# Patient Record
Sex: Male | Born: 1990 | Race: White | Hispanic: No | Marital: Married | Smoking: Never smoker
Health system: Southern US, Community
[De-identification: ages and names within clinical notes are randomized; demographics above are authoritative.]

## PROBLEM LIST (undated history)

## (undated) DIAGNOSIS — R2689 Other abnormalities of gait and mobility: Secondary | ICD-10-CM

## (undated) DIAGNOSIS — D821 Di George's syndrome: Secondary | ICD-10-CM

## (undated) DIAGNOSIS — I519 Heart disease, unspecified: Secondary | ICD-10-CM

## (undated) HISTORY — PX: CARDIAC SURGERY: SHX584

## (undated) HISTORY — DX: Di George's syndrome: D82.1

## (undated) HISTORY — DX: Other abnormalities of gait and mobility: R26.89

## (undated) HISTORY — DX: Heart disease, unspecified: I51.9

---
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
## 2010-07-16 NOTE — Op Note (Signed)
. [facility]  Patient:    Bradley Cobb, Bradley Cobb                      MRN: [id number] Proc. Date: 09/08/99 Adm. Date:  [id number] Attending:  Maurilio Lovely                           Operative Report  PREOPERATIVE DIAGNOSES: 1. Multiple dental caries. 2. Phimosis. 3. 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 4. Status post repair of complex cardiac anomaly, bilateral undescended    testicles and a Nissen fundoplication with gastrostomy.  POSTOPERATIVE DIAGNOSES: 1. Multiple dental caries. 2. Phimosis. 3. 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 4. Status post repair of complex cardiac anomaly, bilateral undescended    testicles and a Nissen fundoplication with gastrostomy.  OPERATION PERFORMED:  Repair of multiple dental caries by Dr. Rick Duff and circumcision by Dr. Levie Heritage.  PROCEDURE: Patient under satisfactory general endotracheal anesthesia, while Dr. Rick Duff was performing her dental surgical procedure, genital area was thoroughly prepped and draped in the usual manner.  Circumferential incision was made over the distal aspect of the penis.  Skin was undermined distally. Bleeders clamped, cut and electrocoagulated.  Dorsal slit incision was made and during this time, dense adhesions between the prepuce and glans were dissected.  There was avulsion of the mucosa around the left lower aspect of the junction between the prepuce and the glans penis.  The remainder of the mucosa was incised about 3 mm from the coronal sulcus.  The redundant prepuce and mucosa were excised.  And now the approximation of the mucosa and the skin was done with running interlocking sutures over most of the circumference except in the left lower quadrant area where the mucosa was avulsed. At this area, interrupted sutures were placed carefully to bring in the remainder of the glans tissue to the skin.  Hemostasis was satisfactory and 0.25% with epinephrine was injected locally for postoperative  analgesia. 5-0 chromic interrupted, as well as, running interlocking sutures.  Neosporin dressing applied.  Throughout the procedure, patients vital signs remained stable, patient withstood the procedure well and was transferred to recovery room in satisfactory general condition. DD:  09/08/99 TD:  09/08/99 Job: [id number]

## 2010-07-16 NOTE — Op Note (Signed)
[name]. [facility]  Patient:    Bradley Cobb, Bradley Cobb Visit Number: [id number] MRN: [id number]          Service Type: DSU Location: [facility] 2899 28 Attending Physician:  Damita Dunnings Dictated by:   Cleotilde Neer. Jeanella Craze, D.D.S., MPH Proc. Date: 05/25/01 Admit Date:  05/25/2001 Discharge Date: 05/25/2001                             Operative Report  PREOPERATIVE DIAGNOSES: 1. Multiple carious teeth. 2. DiGeorge syndrome.  POSTOPERATIVE DIAGNOSES: 1. Multiple carious teeth. 2. DiGeorge syndrome.  PROCEDURE PERFORMED:  Full mouth dental rehabilitation.  SPECIMENS:  One tooth for count only.  DRAINS:  None.  CULTURES: None.  ESTIMATED BLOOD LOSS:  Less than 5 cc.  PROCEDURE:  The patient was brought from the holding area to OR #1 at [facility].  The patient was placed in the supine position on the operating table.  General anesthesia was induced by mask.  IV access was obtained and direct nasotracheal intubation was established.  Six intraoral radiographs were obtained and a throat pack was placed.  The dental treatment was as follows:  Tooth C and H received stainless steel crowns with a composite resin facing. Tooth H also received a pulpotomy.  Teeth 19 and 30 received sealants.  Tooth 3 received a composite resin.  Lidocaine 1.8 cc 2% with 1:100,000 epinephrine was administered.  Tooth #14 was elevated and extracted with forceps.  Gelfoam was placed in the sockets.  Two chromic gut sutures were placed.  Hemorrhage control was good.  The mouth was thoroughly cleansed and the throat pack was removed.  The patient was undraped and extubated in the operating room.  The patient tolerated the procedure well and was taken to the PACU in stable condition with IV in place. Dictated by:   Cleotilde Neer. Jeanella Craze, D.D.S., MPH Attending Physician:  Damita Dunnings DD:  05/25/01 TD:  05/26/01 Job: [phone]

## 2010-07-16 NOTE — Op Note (Signed)
[place]. [facility]  Patient:    Bradley Cobb, Bradley Cobb                      MRN: [id number] Proc. Date: 09/08/99 Adm. Date:  [id number] Disc. Date: [id number] Attending:  Maurilio Lovely                           Operative Report  PREOPERATIVE DIAGNOSES: 1. Multiple carious teeth. 2. DeGeorge syndrome.  POSTOPERATIVE DIAGNOSES: 1. Multiple carious teeth. 2. DeGeorge syndrome.  PROCEDURE:  Full-mouth dental rehabilitation.  SURGEON:  Cipriano Mile. Rick Duff, D.D.S., M.S.  ASSISTANT:  Abigail Butts.  SPECIMENS:  Two teeth for count only, given to mother.  DRAINS:  None.  CULTURES:  None.  ESTIMATED BLOOD LOSS:  Less than 5 cc.  DESCRIPTION OF PROCEDURE:  The patient was brought from the holding area to OR room #1 at [facility].  The patient was placed in the supine position on the operating table, and general anesthesia was induced by mask with sevoflurane, nitrous oxide, and oxygen.  IV access was obtained, and direct nasoendotracheal intubation was established.  Five intraoral radiographs were obtained, and a throat pack was placed at 9:20 a.m.  The dental treatment was as follows:  All teeth being treated were isolated with the rubber dam.  Teeth #14 and 30 received occlusal composites.  Teeth C, H, and M received facial composites.  These teeth were acid etched, single-bone adhesive, Flowit composite was placed followed by Z100 composite, and Delton opaque sealant.  Tooth #3 received a sealant.  This tooth was acid etched, single-bone adhesive, and Delton opaque sealant was placed.  Teeth #B, I, L, and S received stainless steel crowns, Ion #3, cemented with Fuji cement.  To obtain local anesthesia and hemorrhage control, 0.9 cc of 2% lidocaine with 1:100,000 epinephrine was used.  Teeth #N and Q were extracted for space and eruption of teeth #24 and 25.  The mouth was thoroughly cleansed, and the throat pack was removed.  The patient was  undraped and extubated in the operating room.  The patient tolerated the procedures well and was taken to the PACU in stable condition with IV in place.  Dr. Levie Heritage followed this procedure with circumcision, and his note should follow. DD:  09/10/99 TD:  09/10/99 Job: [id number]

## 2011-02-15 IMAGING — CR DG KNEE COMPLETE 4+V*L*
5 series · 5 of 5 positions shown · non-contrast
Comparison: None

CLINICAL DATA: Twisted left knee.

LEFT KNEE - COMPLETE 4+ VIEW

[t knee ap left]
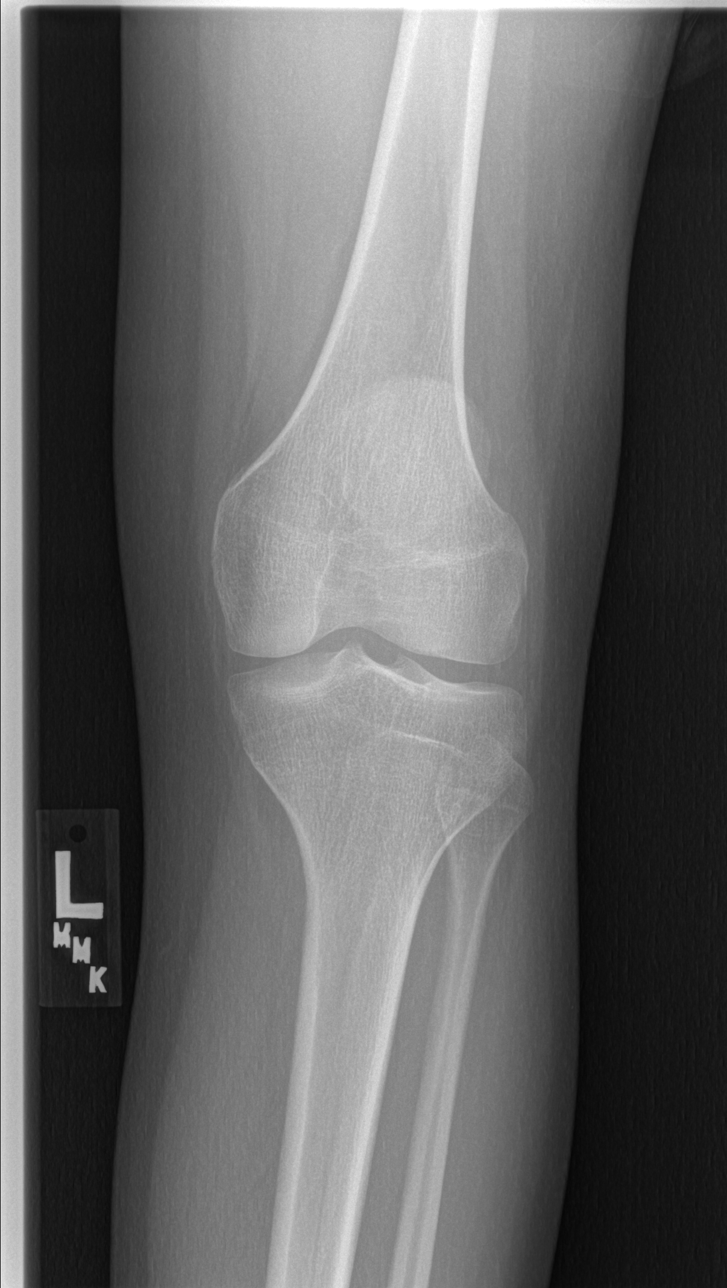

[t knee oblique left (1 of 2)]
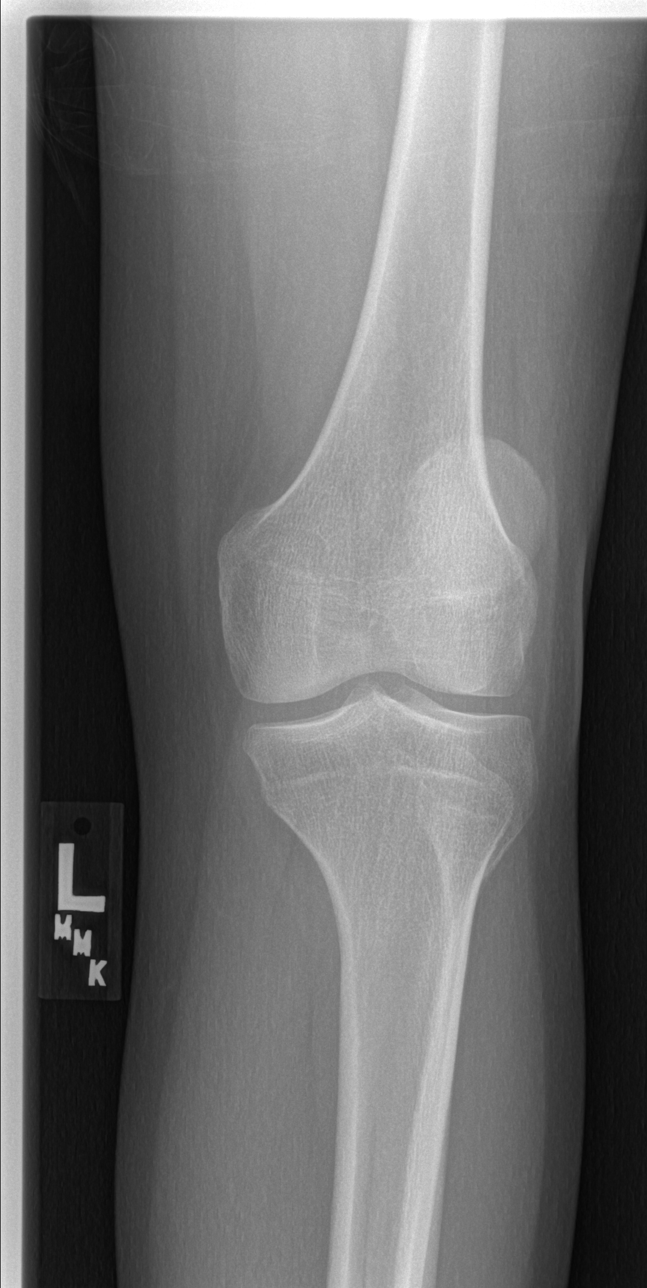

[t knee oblique left (2 of 2)]
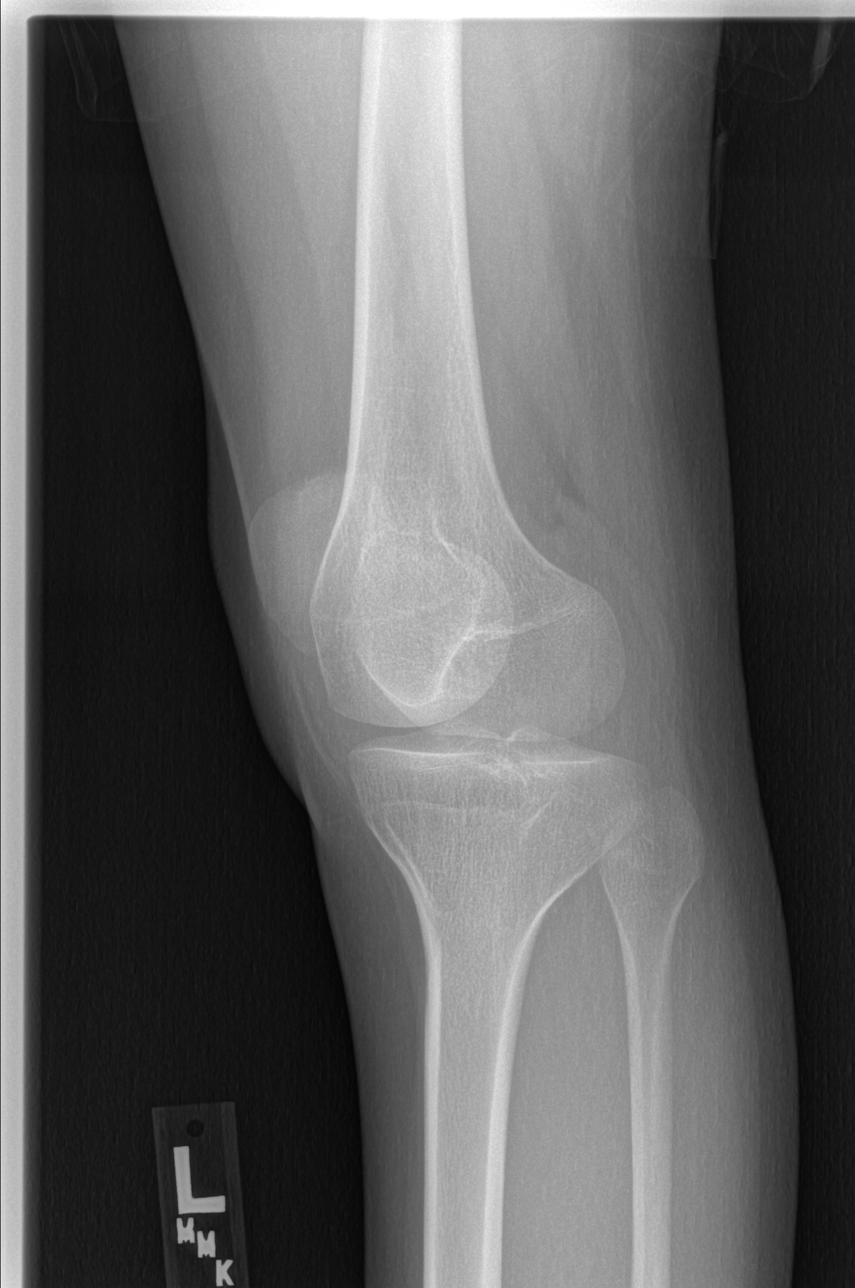

[t knee lat left (1 of 2)]
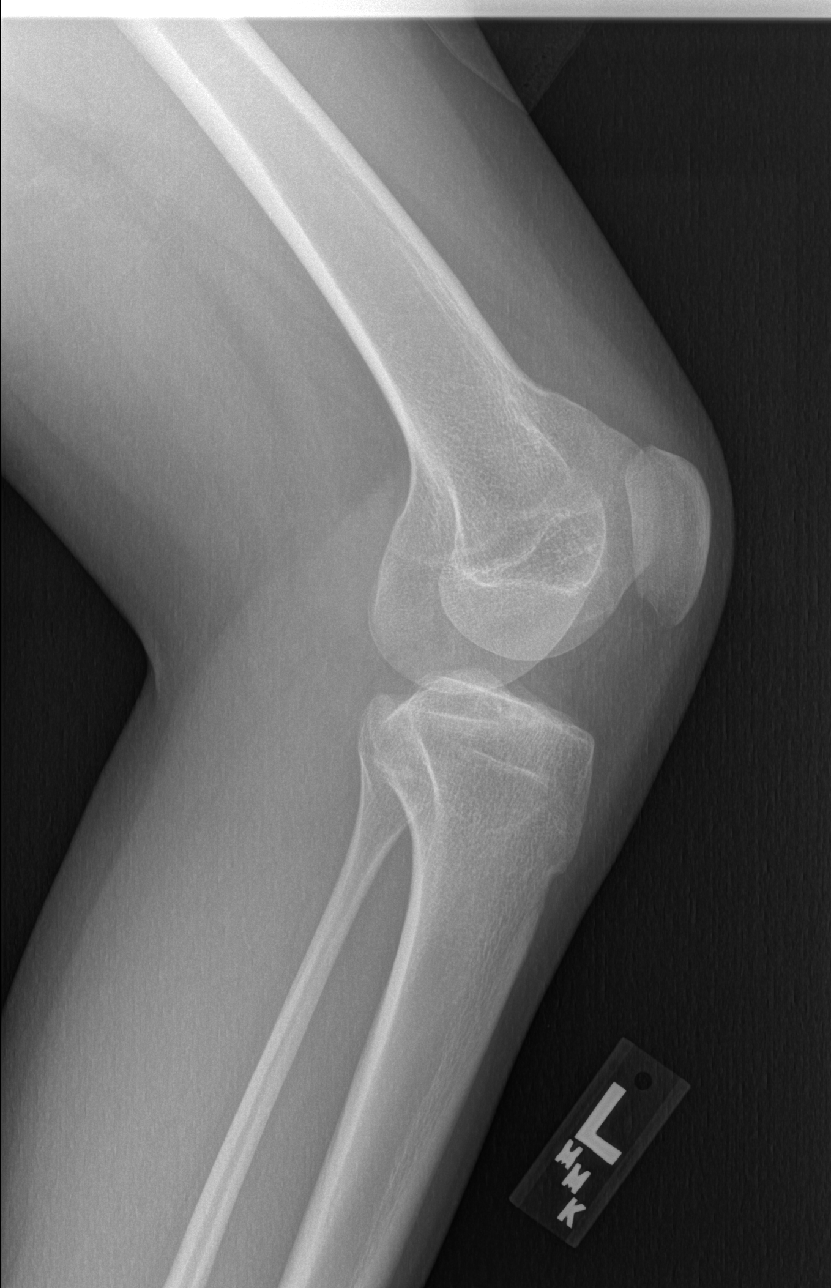

[t knee lat left (2 of 2)]
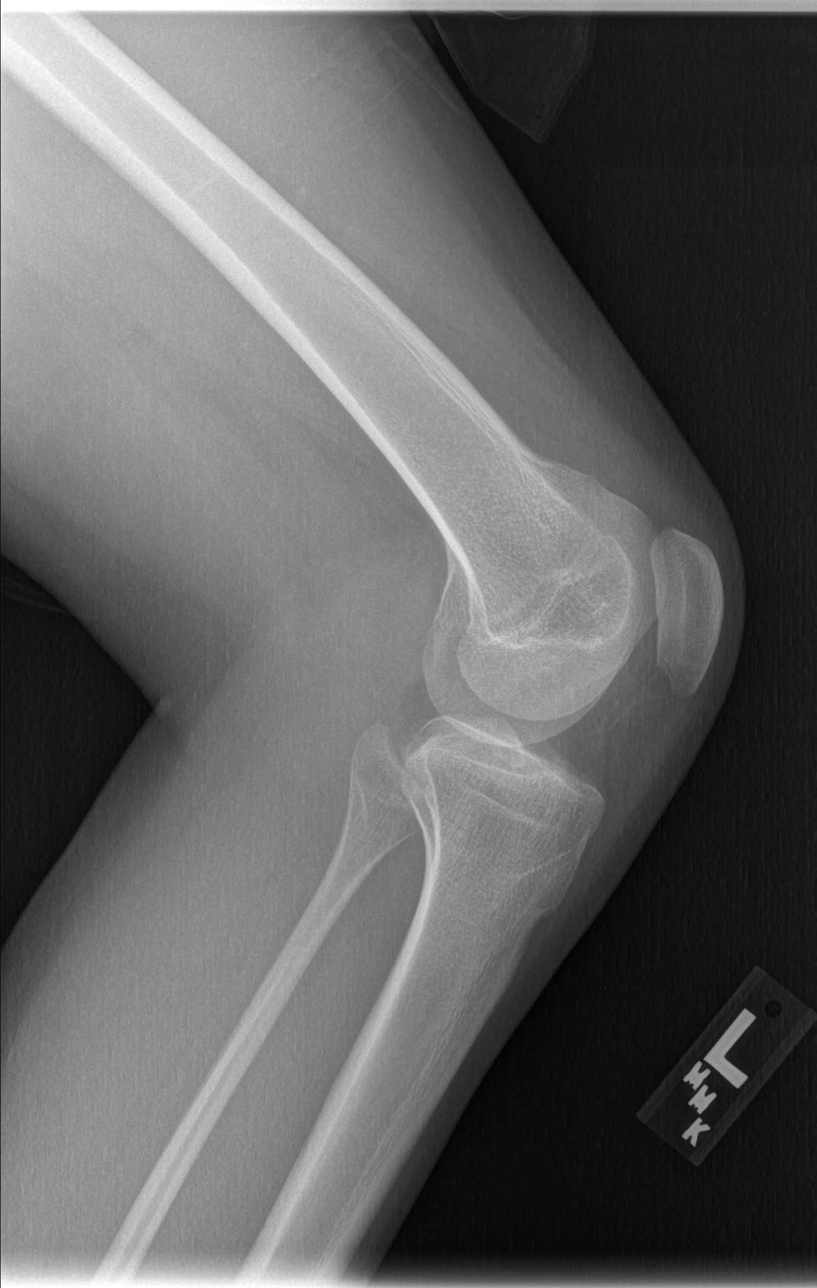

[5 of 5 positions shown; findings below may reference images not displayed]

FINDINGS: The joint spaces are maintained.  No fractures are seen.
IMPRESSION: No acute bony findings.

## 2011-10-18 DIAGNOSIS — D821 Di George's syndrome: Secondary | ICD-10-CM | POA: Insufficient documentation

## 2012-08-09 DIAGNOSIS — R6250 Unspecified lack of expected normal physiological development in childhood: Secondary | ICD-10-CM | POA: Insufficient documentation

## 2015-05-01 ENCOUNTER — Encounter: Payer: Self-pay | Admitting: *Deleted

## 2015-05-07 ENCOUNTER — Encounter: Payer: Self-pay | Admitting: Neurology

## 2015-05-07 ENCOUNTER — Ambulatory Visit (INDEPENDENT_AMBULATORY_CARE_PROVIDER_SITE_OTHER): Payer: Self-pay | Admitting: Neurology

## 2015-05-07 VITALS — BP 112/76 | Ht <= 58 in | Wt 115.5 lb

## 2015-05-07 DIAGNOSIS — R269 Unspecified abnormalities of gait and mobility: Secondary | ICD-10-CM

## 2015-05-07 NOTE — Progress Notes (Signed)
Patient: Bradley Cobb MRN: [id number] Sex: male DOB: [birth year]  Provider: Keturah ShaversNABIZADEH, Carrington Olazabal, MD [facility]  Note type: Nx Patient  Referral Source: Dr. Asencion Partridgeamille Andy History [facility] and [facility]ive mother Chief Complaint: DiGeorge syndrome, Gait instability, Balance problems  History of Present Illness: Bradley Cobb is a 25 y.o. male is here as a patient with new complaints. I discussed with mother that due to his age which is [age] at this time, it would be the best of his interest to see an adult neurology since he might need more diagnostic workup or treatment so it would be better to start this new complaint with adult neurology and continue follow-up with them. I did not perform any history or exam and there would be no charge for this visit.  Allergies  Allergen Reactions  . Sulfa Antibiotics Nausea And Vomiting  . Other     Seasonal Allergies    . Amoxicillin Nausea And Vomiting    Physical Exam BP 112/76 mmHg  Ht 4\' 10"  (1.473 m)  Wt 115 lb 8.3 oz (52.4 kg)  BMI 24.15 kg/m2 Exam was not performed.

## 2015-07-16 ENCOUNTER — Ambulatory Visit (INDEPENDENT_AMBULATORY_CARE_PROVIDER_SITE_OTHER): Payer: Medicaid Other | Admitting: Neurology

## 2015-07-16 ENCOUNTER — Encounter: Payer: Self-pay | Admitting: Neurology

## 2015-07-16 ENCOUNTER — Telehealth: Payer: Self-pay | Admitting: Neurology

## 2015-07-16 VITALS — BP 116/80 | HR 92 | Ht <= 58 in | Wt 117.0 lb

## 2015-07-16 DIAGNOSIS — R6250 Unspecified lack of expected normal physiological development in childhood: Secondary | ICD-10-CM | POA: Diagnosis not present

## 2015-07-16 DIAGNOSIS — D821 Di George's syndrome: Secondary | ICD-10-CM | POA: Diagnosis not present

## 2015-07-16 DIAGNOSIS — R269 Unspecified abnormalities of gait and mobility: Secondary | ICD-10-CM | POA: Diagnosis not present

## 2015-07-16 NOTE — Progress Notes (Signed)
PATIENT: Bradley Cobb DOB: 1990-09-07  Chief Complaint  Patient presents with  . Gait Instability    "Bradley Cobb" is here with his adoptive mother, Vesta Mixer.  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  His mother reports his perception of instability has worsened and it has caused him to have an extreme fear of falling.       HISTORICAL  Bradley Cobb is a 25 years old male, accompanied by his mother, seen in refer by his primary care physician Dr. Willow Ora for evaluation of worsening anxiety, unsteady gait  Patient carry a diagnosis of DiGeorge disease, he was adopted by his mother Vesta Mixer at age 66, he was developmentally delayed, return to walk talk at age 12, went to special education, at baseline, he is hypersensitive to any sensory stimulation, need help in dressing and self cleaning  Ever since childhood, he tends to have fear anxiety when noticed uneven terrain, or any foreign subject on the floor, getting worse since 2016, he will not go to his bedroom, has to hold on somebody's hand, after a floor runner was put in, he did much better, he tends to touch subject while walking at his home,  There was otherwise no significant regression on the other part of his daily function  REVIEW OF SYSTEMS: Full 14 system review of systems performed and notable only for gait abnormality, snoring  ALLERGIES: Allergies  Allergen Reactions  . Sulfa Antibiotics Nausea And Vomiting  . Other     Seasonal Allergies    . Amoxicillin Nausea And Vomiting    HOME MEDICATIONS: Current Outpatient Prescriptions  Medication Sig Dispense Refill  . budesonide (RHINOCORT AQUA) 32 MCG/ACT nasal spray Place into the nose.    . loratadine (CLARITIN) 10 MG tablet Take 10 mg by mouth.    . sodium fluoride (SF 5000 PLUS) 1.1 % CREA dental cream Place 1 application onto teeth at bedtime.      No current facility-administered medications for this visit.    PAST MEDICAL HISTORY: Past  Medical History  Diagnosis Date  . 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 Heart disease   . Imbalance     PAST SURGICAL HISTORY: Past Surgical History  Procedure Laterality Date  . Cardiac surgery  1992  . Hernia repair  2001    FAMILY HISTORY: Family History  Problem Relation Age of Onset  . Adopted: Yes  . Mental illness Mother     SOCIAL HISTORY:  Social History   Social History  . Marital Status: Married    Spouse Name: N/A  . Number of Children: 0  . Years of Education: Certificat   Occupational History  . Disabled    Social History Main Topics  . Smoking status: Never Smoker   . Smokeless tobacco: Never Used  . Alcohol Use: No  . Drug Use: No  . Sexual Activity: No   Other Topics Concern  . Not on file   Social History Narrative   Bradley Cobb attends [employer] three days a week.   He lives with his adoptive parents. He has an older adoptive sister.   Right-handed.   Drinks three soft drinks weekly.     PHYSICAL EXAM   Filed Vitals:   07/16/15 1010  BP: 116/80  Pulse: 92  Height:  (1.473 m)  Weight: 117 lb (53.071 kg)    Not recorded      Body mass index is 24.46 kg/(m^2).  PHYSICAL EXAMNIATION:  Gen: NAD, conversant, well nourised, obese, well groomed                     Cardiovascular: Regular rate rhythm, no peripheral edema, warm, nontender. Eyes: Conjunctivae clear without exudates or hemorrhage Neck: Supple, no carotid bruise. Pulmonary: Clear to auscultation bilaterally   NEUROLOGICAL EXAM:  MENTAL STATUS: Cooperative on examination, follow commands   CRANIAL NERVES: CN II: Visual fields are full to confrontation.  Pupils are round equal and briskly reactive to light. CN III, IV, VI: extraocular movement are normal. No ptosis. CN V: Facial sensation is intact to pinprick in all 3 divisions bilaterally. Corneal responses are intact.  CN VII: Face is symmetric with normal eye closure and smile. CN VIII: Hearing is  normal to rubbing fingers CN IX, X: Palate elevates symmetrically. Phonation is normal. CN XI: Head turning and shoulder shrug are intact CN XII: Tongue is midline with normal movements and no atrophy.  MOTOR: There is no pronator drift of out-stretched arms. Muscle bulk and tone are normal. Muscle strength is normal.  REFLEXES: Present and symmetric. Plantar responses are flexor.  SENSORY: Withdraw to pain  COORDINATION: Rapid alternating movements and fine finger movements are intact. There is no dysmetria on finger-to-nose and heel-knee-shin.    GAIT/STANCE: mildly unsteady, but his gait would much improved if he hold on  DIAGNOSTIC DATA (LABS, IMAGING, TESTING) - I reviewed patient records, labs, notes, testing and imaging myself where available.   ASSESSMENT AND PLAN  Bradley Cobb is a 25 y.o. male   DiGeorge Syndrome Mental retardation Worsening gait difficulty  Need to rule out central nervous system etiology  MRI of the brain without contrast, [facility], he will have dental cleaning in August under general anesthesia  Levert FeinsteinYijun Jamisen Duerson, M.D. Ph.D.  [facility] [address], [address] [place], [place] [address] Ph: [phone] Fax: [phone]  CC: Willow Oraamille L Andy, MD

## 2015-07-16 NOTE — Telephone Encounter (Signed)
he is going to havedental cleaning procedure [facility] in August 2017, please coordinate MRI of the brain without contrast [facility] at the same time.

## 2015-07-16 NOTE — Telephone Encounter (Signed)
[facility].  He has a pending appt for his dental procedure on 10/15/15 - the time has not been set yet.  He will be having this procedure in the General Surgery Dept (Ph: [phone]).  His MRI appt will need to be coordinated with his procedure appt so he will be under general anesthesia for his scan.  [facility] MRI dept number is [phone].

## 2015-07-29 NOTE — Telephone Encounter (Signed)
Message For: OFFICE               Taken 31-MAY-17 at 12:55PM by CEP ------------------------------------------------------------  Maudry Diegoaller  [name]            CID  [phone]   Patient  Bradley Cobb         Pt's Dr  Terrace ArabiaYAN           Area Code  336  Phone#  [phone] *  DOB  [birth year]      RE  CALLING WITH DATE OF ORAL SURG. 10/15/15, SO       PT CAN HAVE MRI WHILE HE IS UNDER ANESTHESIA -->      Disp:Y/N  N  If Y = C/B If No Response In 20minutes   HAS SOME ?S, P/C/B

## 2015-09-03 ENCOUNTER — Telehealth: Payer: Self-pay | Admitting: Neurology

## 2015-09-03 NOTE — Telephone Encounter (Signed)
Mom called regarding scheduling [facility] appointment [facility]. Please call [phone].

## 2015-09-04 NOTE — Telephone Encounter (Signed)
Returned call to the patients mother did not get an answer but left a VM asking her to return my call. If she calls back please inform her that referral has been [facility] and she will need to call them to schedule.

## 2015-09-07 NOTE — Telephone Encounter (Signed)
Pts mother called to phone number of who she needs to [facility].

## 2015-09-07 NOTE — Telephone Encounter (Signed)
Spoke with the mother and gave her the [facility]. Sent referral again.

## 2015-09-14 NOTE — Telephone Encounter (Signed)
Kathy[facility] [phone] scheduling called said will need [place] access approval before MRI can be scheduled.

## 2015-09-16 NOTE — Telephone Encounter (Signed)
Rayna Sextonalled Kathy and relayed that it had been submitted for authorization.

## 2015-09-21 NOTE — Telephone Encounter (Signed)
[facility] called back again about the authorization for the MRI/brain w/o contrast. Phone [phone]. Fax [phone].

## 2015-09-22 ENCOUNTER — Telehealth: Payer: Self-pay | Admitting: Neurology

## 2015-09-22 NOTE — Telephone Encounter (Signed)
Pt's mother called and says that she received a denial letter for MRI while pt is asleep for teeth cleaning. Pts appt for teeth cleaning is August 17. Can this be appealed quickly? Please call and advise [phone]

## 2015-09-22 NOTE — Telephone Encounter (Signed)
Returned [employer] call but she was not in the office, left a message to have her call me back.

## 2015-09-24 NOTE — Telephone Encounter (Signed)
Pt's father called it get update about MRI for son while at [facility] for [facility]. Danielle took call

## 2015-09-28 NOTE — Telephone Encounter (Signed)
MRI has been authorized after reconsideration. Authorization is [id number] (10/16/15). Called the patients father back to inform him.

## 2015-09-30 NOTE — Telephone Encounter (Signed)
Called patients mother and informed her that the authorization had been given [facility].

## 2015-09-30 NOTE — Telephone Encounter (Signed)
Spoke with someone [facility] and gave the authorization number to them.

## 2015-10-26 ENCOUNTER — Telehealth: Payer: Self-pay | Admitting: Neurology

## 2015-10-26 NOTE — Telephone Encounter (Signed)
Pt's mother called said he [facility] and has not heard back reg the results. She said the results were to be sent to [facility]. I relayed to her that it did not look like the results had been sent and she should check with [facility]. Please call

## 2015-10-27 NOTE — Telephone Encounter (Signed)
Spoke to [name] in [facility] [phone]).   She requested we send over a med release from our office so she can mail disc and fax results.  Faxed request to [phone] and received confirmation.  Returned call to pt's mother (on HIPPA) to let her know [facility] will be mailing the disc to [name]'s attention.

## 2015-10-28 ENCOUNTER — Telehealth: Payer: Self-pay | Admitting: *Deleted

## 2015-10-28 NOTE — Telephone Encounter (Signed)
MRI [facility] - completed on 10/15/15:  Conclusion:  1) No acute intracranial abnormality. 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

## 2015-10-29 ENCOUNTER — Telehealth: Payer: Self-pay | Admitting: Neurology

## 2015-10-29 NOTE — Telephone Encounter (Signed)
I discussed with his mother about his MRI report, no acute abnormality,  He has new glasses prescription, however see him in November 19 2015, will review MRI film at follow up visit.

## 2015-10-29 NOTE — Telephone Encounter (Signed)
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

## 2015-11-19 ENCOUNTER — Ambulatory Visit (INDEPENDENT_AMBULATORY_CARE_PROVIDER_SITE_OTHER): Payer: Medicaid Other | Admitting: Neurology

## 2015-11-19 ENCOUNTER — Encounter: Payer: Self-pay | Admitting: Neurology

## 2015-11-19 VITALS — BP 121/68 | HR 68 | Ht <= 58 in | Wt 115.8 lb

## 2015-11-19 DIAGNOSIS — R6250 Unspecified lack of expected normal physiological development in childhood: Secondary | ICD-10-CM

## 2015-11-19 DIAGNOSIS — R269 Unspecified abnormalities of gait and mobility: Secondary | ICD-10-CM | POA: Diagnosis not present

## 2015-11-19 DIAGNOSIS — D821 Di George's syndrome: Secondary | ICD-10-CM

## 2015-11-19 NOTE — Progress Notes (Signed)
PATIENT: Bradley Cobb DOB: 1990/12/08  Chief Complaint  Patient presents with  . Gait Abnormality    "Bradley Cobb" is here with his adoptive mother, Bradley Cobb, to review his MRI.     HISTORICAL  Bradley Cobb is a 25 years old male, accompanied by his mother, seen in refer by his primary care physician Dr. Willow Oraamille L Andy for evaluation of worsening anxiety, unsteady gait  Patient carry a diagnosis of DiGeorge disease, he was adopted by his mother Bradley Cobb at age 25, he was developmentally delayed, return to walk talk at age 89, went to special education, at baseline, he is hypersensitive to any sensory stimulation, need help in dressing and self cleaning  Ever since childhood, he tends to have fear anxiety when noticed uneven terrain, or any foreign subject on the floor, getting worse since 2016, he will not go to his bedroom, has to hold on somebody's hand, after a floor runner was put in, he did much better, he tends to touch subject while walking at his home,  There was otherwise no significant regression on the other part of his daily function  Update November 19 2015: His hesitation of working through uneven floor has much improved after he got a new pair of glasses, but still not at his baseline level, he has a tendency of hold on to people when he crosses the uneven floor, a crack on the floor, no falling episode,  We have personally reviewed MRI scan in August 2017 [facility]: no acute intracranial abnormality 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  REVIEW OF SYSTEMS: Full 14 system review of systems performed and notable only for gait abnormality, snoring  ALLERGIES: Allergies  Allergen Reactions  . Sulfa Antibiotics Nausea And Vomiting  . Other     Seasonal Allergies    . Amoxicillin Nausea And Vomiting    HOME MEDICATIONS: Current Outpatient  Prescriptions  Medication Sig Dispense Refill  . budesonide (RHINOCORT AQUA) 32 MCG/ACT nasal spray Place into the nose as needed.     . loratadine (CLARITIN) 10 MG tablet Take 10 mg by mouth.    . sodium fluoride (SF 5000 PLUS) 1.1 % CREA dental cream Place 1 application onto teeth at bedtime.      No current facility-administered medications for this visit.     PAST MEDICAL HISTORY: Past Medical History:  Diagnosis Date  . 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 Heart disease   . Imbalance     PAST SURGICAL HISTORY: Past Surgical History:  Procedure Laterality Date  . CARDIAC SURGERY  1992  . HERNIA REPAIR  2001    FAMILY HISTORY: Family History  Problem Relation Age of Onset  . Adopted: Yes  . Mental illness Mother     SOCIAL HISTORY:  Social History   Social History  . Marital status: Married    Spouse name: N/A  . Number of children: 0  . Years of education: Certificat   Occupational History  . Disabled    Social History Main Topics  . Smoking status: Never Smoker  . Smokeless tobacco: Never Used  . Alcohol use No  . Drug use: No  . Sexual activity: No   Other Topics Concern  . Not on file   Social History Narrative   Bradley Cobb attends [employer] three days a week.   He lives with his adoptive parents. He  has an older adoptive sister.   Right-handed.   Drinks three soft drinks weekly.     PHYSICAL EXAM   Vitals:   11/19/15 1126  BP: 121/68  Pulse: 68  Weight: 115 lb 12 oz (52.5 kg)  Height: 4\' 10"  (1.473 m)    Not recorded      Body mass index is 24.19 kg/m.  PHYSICAL EXAMNIATION:  Gen: NAD, conversant, well nourised, obese, well groomed                     Cardiovascular: Regular rate rhythm, no peripheral edema, warm, nontender. Eyes: Conjunctivae clear without exudates or hemorrhage Neck: Supple, no carotid bruise. Pulmonary: Clear to auscultation bilaterally   NEUROLOGICAL EXAM:  MENTAL STATUS: Cooperative on  examination, follow commands, hesitate speech   CRANIAL NERVES: CN II: Visual fields are full to confrontation.  Pupils are round equal and briskly reactive to light. CN III, IV, VI: extraocular movement are normal. No ptosis. CN V: Facial sensation is intact to pinprick in all 3 divisions bilaterally. Corneal responses are intact.  CN VII: Face is symmetric with normal eye closure and smile. CN VIII: Hearing is normal to rubbing fingers CN IX, X: Palate elevates symmetrically. Phonation is normal. CN XI: Head turning and shoulder shrug are intact CN XII: Tongue is midline with normal movements and no atrophy.  MOTOR: Muscle bulk and tone are normal. Muscle strength is normal.  REFLEXES: Present and symmetric. Plantar responses are flexor.  SENSORY: Withdraw to pain  COORDINATION: There is no dysmetria on finger-to-nose and heel-knee-shin.    GAIT/STANCE: mildly unsteady, but his gait would much improved if he hold on  DIAGNOSTIC DATA (LABS, IMAGING, TESTING) - I reviewed patient records, labs, notes, testing and imaging myself where available.   ASSESSMENT AND PLAN  Bradley Cobb is a 25 y.o. male   DiGeorge Syndrome Mental retardation Worsening gait difficulty  His symptoms has improved, MRI of the brain showed no new lesion  Only return to clinic for new issues  Levert Feinstein, M.D. Ph.D.  [facility] [address], [address] [place], [place] [address] Ph: [phone] Fax: [phone]  CC: Willow Ora, MD

## 2015-12-17 ENCOUNTER — Encounter: Payer: Self-pay | Admitting: Neurology

## 2017-06-09 ENCOUNTER — Telehealth: Payer: Self-pay

## 2017-06-09 NOTE — Telephone Encounter (Signed)
NOTES ON FILE RJ 

## 2017-06-12 ENCOUNTER — Telehealth: Payer: Self-pay

## 2017-06-12 NOTE — Telephone Encounter (Signed)
NOTES FAXED TO [facility] °

## 2017-07-12 ENCOUNTER — Encounter: Payer: Self-pay | Admitting: Cardiology

## 2017-07-12 NOTE — Progress Notes (Signed)
Cardiology Office Note   Date:  07/13/2017   ID:  Bradley Cobb, DOB November 18, 1990, MRN [id number]  PCP:  Tracey Harries, MD  Cardiologist:   No primary care provider on file. Referring:  Self  Chief Complaint  Patient presents with  . Follow-up      History of Present Illness: Bradley Cobb is a 27 y.o. male who is referred by his mom for evaluation of a history of aortic arch repair as a child.  He has a history of DiGeorge syndrome.  He was being seen by pediatric cardiologist but has aged out.  He has had his arch repaired.  This was before he was adopted so his mom is somewhat fuzzy on the details.  There is a mention in accompanying notes of a bicuspid aortic valve.  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  He has mild aortic stenosis apparently residual.  There is also mention of a VSD repair.  I do not have any access to any recent imaging.  Because of his agitation he cannot get echocardiograms unless he is seated.  His mom says he could get them if he is lying on the floor.  Typically he has these when he is sedated for some other procedure like dental extraction although she is not sure when this happened most recently.  She does not think he has had any follow-up CTs.  He lives with his mom and he watches TV.  He might go for walks with her in the park although he has had some gait disturbance and she has to hold his hand.  He will rarely do a treadmill.  There is been no evidence of shortness of breath, PND or orthopnea.  He has had no palpitations, presyncope or syncope.  He denies any chest pressure, neck or arm discomfort.  He is minimally verbal.  He loves to walk shows about lightening and the television show "Cops".    Past Medical History:  Diagnosis Date  . 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 Heart disease   . Imbalance     Past Surgical History:  Procedure Laterality Date  . CARDIAC SURGERY  1992  . HERNIA REPAIR  2001     Current Outpatient Medications    Medication Sig Dispense Refill  . budesonide (RHINOCORT AQUA) 32 MCG/ACT nasal spray Place into the nose as needed.     . loratadine (CLARITIN) 10 MG tablet Take 10 mg by mouth.    . sodium fluoride (SF 5000 PLUS) 1.1 % CREA dental cream Place 1 application onto teeth at bedtime.      No current facility-administered medications for this visit.     Allergies:   Sulfa antibiotics; Other; and Amoxicillin    Social History:  The patient  reports that he has never smoked. He has never used smokeless tobacco. He reports that he does not drink alcohol or use drugs.   Family History:  The patient's  family history includes Mental illness in his mother. He was adopted.    ROS:  Please see the history of present illness.   Otherwise, review of systems are positive for none.   All other systems are reviewed and negative.    PHYSICAL EXAM: VS:  BP 118/70 (BP Location: Left Arm, Patient Position: Sitting, Cuff Size: Normal)   Pulse (!) 112   Ht  (1.499 m)   Wt 114 lb (51.7 kg)   BMI 23.03 kg/m  , BMI Body mass index  is 23.03 kg/m. GEN:  No distress NECK:  No jugular venous distention at 90 degrees, waveform within normal limits, carotid upstroke brisk and symmetric, no bruits, no thyromegaly LYMPHATICS:  No cervical adenopathy LUNGS:  Clear to auscultation bilaterally BACK:  No CVA tenderness CHEST:  Unremarkable HEART:  S1 within normal limits and S2 within normal limits, no S3, no S4, no clicks, no rubs, 2 out of 6 apical systolic murmur murmurs ABD:  Positive bowel sounds normal in frequency in pitch, no bruits, no rebound, no guarding, unable to assess midline mass or bruit with the patient seated. EXT:  2 plus pulses throughout, mild non pitting leg edema, no cyanosis no clubbing NEURO:  Cranial nerves II through XII grossly intact, motor grossly intact throughout PSYCH:  Pleasant    EKG:  EKG is ordered today. The ekg ordered today demonstrates right bundle branch block,  rate 112, left anterior fascicular block, no acute ST-T wave changes, no old EKGs for comparison.   Recent Labs: No results found for requested labs within last 8760 hours.    Lipid Panel No results found for: CHOL, TRIG, HDL, CHOLHDL, VLDL, LDLCALC, LDLDIRECT    Wt Readings from Last 3 Encounters:  07/13/17 114 lb (51.7 kg)  11/19/15 115 lb 12 oz (52.5 kg)  07/16/15 117 lb (53.1 kg)      Other studies Reviewed: Additional studies/ records that were reviewed today include: Outside records. Review of the above records demonstrates:  Please see elsewhere in the note.     ASSESSMENT AND PLAN:  AORTIC ARCH REPAIR/DIGEORGE SYNDROME: I do not have access to the most recent echocardiogram but he is parents do not think this was recent..  I am going to discuss this with Dr. Haskell Flirt.  The patient needs follow up echocardiography which could probably best be accomplished in their office.  They could also have follow-up there as well.  At this point he has no acute complaints and no change in therapy is indicated.  RBBB: I will try to obtain some old records.  I suspect this is chronic.   Current medicines are reviewed at length with the patient today.  The patient does not have concerns regarding medicines.  The following changes have been made:  no change  Labs/ tests ordered today include: None No orders of the defined types were placed in this encounter.    Disposition:   FU with me as needed.  I will try to get follow-up with [facility].     Signed, Rollene Rotunda, MD  07/13/2017 5:59 PM    [facility]

## 2017-07-13 ENCOUNTER — Encounter: Payer: Self-pay | Admitting: Cardiology

## 2017-07-13 ENCOUNTER — Ambulatory Visit (INDEPENDENT_AMBULATORY_CARE_PROVIDER_SITE_OTHER): Payer: Medicaid Other | Admitting: Cardiology

## 2017-07-13 VITALS — BP 118/70 | HR 112 | Ht 59.0 in | Wt 114.0 lb

## 2017-07-13 DIAGNOSIS — Z9889 Other specified postprocedural states: Secondary | ICD-10-CM

## 2017-07-13 DIAGNOSIS — D821 Di George's syndrome: Secondary | ICD-10-CM | POA: Diagnosis not present

## 2017-07-13 DIAGNOSIS — I4510 Unspecified right bundle-branch block: Secondary | ICD-10-CM

## 2017-07-13 NOTE — Patient Instructions (Signed)
Medication Instructions:  Continue current medications  If you need a refill on your cardiac medications before your next appointment, please call your pharmacy.  Labwork: None Ordered  Testing/Procedures: None Ordered  Follow-Up: Your physician wants you to follow-up in: As Needed.      Thank you for choosing [facility]!!       

## 2017-08-21 ENCOUNTER — Telehealth: Payer: Self-pay | Admitting: *Deleted

## 2017-08-21 NOTE — Telephone Encounter (Signed)
Spoke with pt mother letting her know a referral was send to Dr Deatra JamesKrasuski office and someone will be reaching out to her.

## 2017-08-21 NOTE — Telephone Encounter (Signed)
-----   Message from Rollene RotundaJames Hochrein, MD sent at 08/18/2017  1:08 PM EDT ----- Tamela OddiNya,  I spoke with Dr. Deatra JamesKrasuski.  They would be happy to do his echo there and think that they could do it despite the difficulty.  I think they would probably want an office appt first with Dr. Deatra JamesKrasuski.  Please make the referral and talk to his mom please.     ----- Message ----- From: Barrie Dunkerhomas, Carlita Whitcomb N Sent: 07/26/2017   9:43 AM To: Rollene RotundaJames Hochrein, MD  You can send him a staff message in Epic but I will also get his number for you.   ----- Message ----- From: Rollene RotundaHochrein, James, MD Sent: 07/26/2017   9:05 AM To: Barrie DunkerNyasha N Britanny Marksberry  Can you find out a contact number for Dr. Deatra JamesKrasuski.  [facility] and is with [facility] cards. Does he have a cell number or could I send him a staff message in Epic or have him call me on my cell about this patient.  Thanks.

## 2017-08-21 NOTE — Telephone Encounter (Signed)
Called and leave message for pt or his mother to call our office back.

## 2017-08-25 ENCOUNTER — Telehealth: Payer: Self-pay | Admitting: Cardiology

## 2017-08-25 NOTE — Telephone Encounter (Signed)
New message:     FYI: Referral Update  Ref[facility] to [facility] has been approved and someone from their scheduling department will be giving the patient a call

## 2017-09-15 ENCOUNTER — Other Ambulatory Visit: Payer: Self-pay | Admitting: Family Medicine

## 2018-10-13 ENCOUNTER — Other Ambulatory Visit: Payer: Self-pay | Admitting: Family Medicine

## 2019-01-07 ENCOUNTER — Encounter: Payer: Self-pay | Admitting: Physical Therapy

## 2019-01-07 ENCOUNTER — Other Ambulatory Visit: Payer: Self-pay

## 2019-01-07 ENCOUNTER — Ambulatory Visit: Payer: Medicaid Other | Attending: Family Medicine | Admitting: Physical Therapy

## 2019-01-07 DIAGNOSIS — R42 Dizziness and giddiness: Secondary | ICD-10-CM | POA: Diagnosis present

## 2019-01-07 DIAGNOSIS — R2681 Unsteadiness on feet: Secondary | ICD-10-CM | POA: Diagnosis present

## 2019-01-07 DIAGNOSIS — R2689 Other abnormalities of gait and mobility: Secondary | ICD-10-CM | POA: Diagnosis present

## 2019-01-08 NOTE — Therapy (Signed)
[facility] [address] [place], [place], [address] Phone: [phone]   Fax:  [phone]  Physical Therapy Evaluation  Patient Details  Name: Bradley Cobb MRN: [id number] Date of Birth: 05/06/1990 Referring Provider (PT): Tracey Harriesavid Bouska, MD   Encounter Date: 01/07/2019  PT End of Session - 01/08/19 2051    Visit Number  1    Number of Visits  13    Date for PT Re-Evaluation  02/28/19    Authorization Type  Medicaid    PT Start Time  0934    PT Stop Time  1022    PT Time Calculation (min)  48 min    Activity Tolerance  Other (comment)   limited by fear of falling   Behavior During Therapy  Anxious       Past Medical History:  Diagnosis Date  . 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 Heart disease   . Imbalance     Past Surgical History:  Procedure Laterality Date  . CARDIAC SURGERY  1992  . HERNIA REPAIR  2001    There were no vitals filed for this visit.       [facility] PT Assessment - 01/08/19 0001      Assessment   Medical Diagnosis  Vertigo    Referring Provider (PT)  Tracey Harriesavid Bouska, MD    Onset Date/Surgical Date  --   referral 12-31-18; approx. 5 yrs ago for progressive decline     Precautions   Precautions  Fall      Balance Screen   Has the patient fallen in the past 6 months  No    Has the patient had a decrease in activity level because of a fear of falling?   Yes    Is the patient reluctant to leave their home because of a fear of falling?   --   only leaves with caregiver     Prior Function   Level of Independence  Needs assistance with ADLs;Needs assistance with gait;Needs assistance with transfers    Vocation  Student    Comments  attends [employer] 3 days/week      Bed Mobility   Bed Mobility  Sit to Supine    Sit to Supine  Contact Guard/Touching assist   pt fearful of falling - scared to move during eval     Transfers   Transfers  Sit to Stand    Comments  pt stood from  mat table with bracing of LE's upon standing and holding caregiver's hand during transfer       Ambulation/Gait   Ambulation/Gait  Yes    Ambulation/Gait Assistance  4: Min guard    Ambulation Distance (Feet)  50 Feet    Assistive device  1 person hand held assist    Gait Pattern  Step-through pattern    Gait Comments  pt too fearful to amb. without hand held assist      Balance   Balance Assessed  Yes      Static Sitting Balance   Static Sitting - Balance Support  Feet supported;No upper extremity supported      Static Standing Balance   Static Standing - Balance Support  Left upper extremity supported    Static Standing - Comment/# of Minutes  pt unable to stand unsupported - too fearful of falling            Vestibular Assessment - 01/08/19 0001      Vestibular Assessment  General Observation  pt is a 28 yr old male with developmental delay and a severe fear of falling with any movement in unfamiliar environment;  pt unable to stand unsupported on floor due to fear of falling - braced legs against mat table and held caregiver's hand       Symptom Behavior   Subjective history of current problem  Mother states he has had unsteadinesss with ambulation for past 4-5 years but states it has gotten significantly worse within past 6 months     Type of Dizziness   Unsteady with head/body turns;Imbalance   based on observation - pt is not very verbal     Oculomotor Exam   Oculomotor Alignment  Normal    Spontaneous  Absent    Smooth Pursuits  Comment   unable to assess due to cognitive deficits    Saccades  Comment   unable to assess due to cognitive deficits         Objective measurements completed on examination: See above findings.              PT Education - 01/08/19 2049    Education Details  instructed caregiver and mother to try activities with him such as tossing ball to incorporate gaze stabilization, standing unsupported on floor, talling kneeling  if pt able/willing to try    Person(s) Educated  Parent(s);Caregiver(s)    Methods  Explanation;Demonstration    Comprehension  Verbalized understanding          PT Long Term Goals - 01/08/19 2112      PT LONG TERM GOAL #1   Title  Pt will stand for 3" unsupported (no external support of UE's or LE' to assist with stabilization) with SBA on flat surface.    Baseline  Pt unable to stand unsupported at eval - braced both legs against mat table and caregiver's hand for UE support due to fear of falling    Time  6    Period  Weeks    Status  New    Target Date  02/28/19      PT LONG TERM GOAL #2   Title  Pt will amb. 26' in clinic gym without UE support with SBA for increased independence with mobility in home environment.    Baseline  46' with caregiver's hand held assist due to pt's significant fear of falling    Time  6    Period  Weeks    Status  New    Target Date  02/28/19      PT LONG TERM GOAL #3   Title  Pt will negotiate 4 steps with use of bilateral hand rails with SBA.    Baseline  to be assessed when pt is less fearful of falling - caregiver reports pt will not climb onto anything such as golf cart or exam table at MD's office due to fear of falling    Time  6    Period  Weeks    Status  New    Target Date  02/28/19      PT LONG TERM GOAL #4   Title  Amb. with RW 230' for increased community accessibility with CGA on flat, even surface.    Baseline  82' with hand held assist due to fear of falling    Time  6    Period  Weeks    Status  New    Target Date  02/28/19      PT LONG TERM  GOAL #5   Title  Caregiver will demonstrate understanding of HEP for balance and vestibular deficits.    Baseline  dependent    Time  6    Period  Weeks    Status  New    Target Date  02/28/19             Plan - 01/08/19 2053    Clinical Impression Statement  Pt is a 28 yr old male with developmental delay, unsteady gait with significant fear of falling with any  mobility or movement in unfamiliar environment, and decreased standing balance with tremendous fear of falling.  Pt using hand held assist of caregiver for assistance with ambulation and was unable to take a step without holding caregiver's hand.  Pt unable to follow directions due to cognitive deficits, so accurate oculomotor testing was unable to be performed.  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  Visual deficits may be contributing to unsteadiness, but unable to accurately assess; unable to discern if pt has visual deficits with abnormal VOR or if cognitive deficits are preventing him from following directions for accurate testing.  Attempted to lie pt supine from long sitting position on mat but pt very fearful of falling with this transitional movement and resisted the transfer.  Unable to assess nystagmus in any position due to pt's resistance due to fear of falling.    Personal Factors and Comorbidities  Behavior Pattern;Comorbidity 2;Education;Past/Current Experience;Time since onset of injury/illness/exacerbation    Comorbidities  DiGeorge syndrome, developmental delay    Examination-Activity Limitations  Bathing;Locomotion Level;Transfers;Bed Mobility;Bend;Carry;Dressing;Lift;Toileting;Stand;Stairs;Squat    Examination-Participation Restrictions  Community Activity;School;Shop;Interpersonal Relationship;Meal Prep    Stability/Clinical Decision Making  Evolving/Moderate complexity    Clinical Decision Making  Moderate    Rehab Potential  Fair   due to severity of impairments with cognitive deficits   PT Frequency  2x / week    PT Duration  6 weeks    PT Treatment/Interventions  ADLs/Self Care Home Management;Gait training;Stair training;Neuromuscular re-education;Patient/family education;DME Instruction;Balance training;Therapeutic exercise;Therapeutic  activities;Vestibular    PT Next Visit Plan  balance training, try tall kneeling on floor (IF pt will comply); activities with head turns in unsupported seated position; assess step negotiation    PT Home Exercise Plan  tossing ball, tall kneeling, standing unsupported    Consulted and Agree with Plan of Care  Family member/caregiver    Family Member Consulted  mother, Rexene Alberts and caregiver - Aneta Mins       Patient will benefit from skilled therapeutic intervention in order to improve the following deficits and impairments:  Dizziness, Impaired vision/preception, Decreased balance, Decreased cognition, Decreased mobility, Difficulty walking  Visit Diagnosis: Dizziness and giddiness - Plan: PT plan of care cert/re-cert  Other abnormalities of gait and mobility - Plan: PT plan of care cert/re-cert  Unsteadiness on feet - Plan: PT plan of care cert/re-cert     Problem List Patient Active Problem List   Diagnosis Date Noted  . 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 RBBB 07/13/2017  . Abnormality of gait 07/16/2015  . 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 DiGeorge sequence [facility]) 10/18/2011    Zoella Roberti, Donavan Burnet, PT  01/08/2019, 9:27 PM  [facility] [address] [place], [place], [address] Phone: [phone]   Fax:  [phone]  Name: ORDELL PRICHETT MRN: [id number] Date of Birth: January 26, 1991

## 2019-01-22 ENCOUNTER — Ambulatory Visit: Payer: Medicaid Other | Admitting: Physical Therapy

## 2019-01-22 ENCOUNTER — Other Ambulatory Visit: Payer: Self-pay

## 2019-01-22 DIAGNOSIS — R2681 Unsteadiness on feet: Secondary | ICD-10-CM

## 2019-01-22 DIAGNOSIS — R42 Dizziness and giddiness: Secondary | ICD-10-CM | POA: Diagnosis not present

## 2019-01-22 DIAGNOSIS — R2689 Other abnormalities of gait and mobility: Secondary | ICD-10-CM

## 2019-01-23 ENCOUNTER — Encounter: Payer: Self-pay | Admitting: Physical Therapy

## 2019-01-23 NOTE — Therapy (Signed)
[facility] [address] [place] [employer] [place] [employer], [place], [address] Phone: [phone]   Fax:  [phone]  Physical Therapy Treatment  Patient Details  Name: Bradley Cobb MRN: [id number] Date of Birth: [birth year] Referring Provider (PT): Bernerd Limbo, MD   Encounter Date: 01/22/2019  PT End of Session - 01/23/19 2050    Visit Number  2    Number of Visits  13    Date for PT Re-Evaluation  02/28/19    Authorization Type  Medicaid    PT Start Time  2202    PT Stop Time  5427    PT Time Calculation (min)  50 min    Activity Tolerance  Other (comment)   limited by fear of falling   Behavior During Therapy  Flat affect       Past Medical History:  Diagnosis Date  . DiGeorge syndrome ([place])   . Heart disease   . Imbalance     Past Surgical History:  Procedure Laterality Date  . [place]  . HERNIA REPAIR  2001    There were no vitals filed for this visit.  Subjective Assessment - 01/23/19 2047    Subjective  Pt presents to PT accompanied by his mother and caregiver, Colleen Can; pt amb. holding caregiver's hand; mother reports yesterday was not a good day at all for pt - states he was amb. "holding onto the wall"    Patient is accompained by:  Family member   mother and caregiver   Patient Stated Goals  mother's goal - for him to be able to walk safely without RW= status fluctuates    Currently in Pain?  No/denies       TherAct; pt in seated position - assisting pt to laterally flex trunk and touch Rt elbow on mat  - min assist; pt needed max to mod Assist to laterally flex trunk and touch Lt elbow on mat; lightning bolt used as target to fascilitate participation in activity and to achieve this specific movement as pt favors Rt side with body and legs turned toward Rt side in seated position  NeuroRe-ed:  Quadruped position on mat - pt needed max assist to transfer from seated to lying prone to quadruped  position- pt needed Mod assist to maintain balance in this position   Pt performed unsupported sitting on side of mat (improved since initial eval as pt would not let go of mat with either hand during initial eval) Pt performed ball toss in seated position  Progressed to standing in corner in attempt to achieve pt standing unsupported - pt too fearful of falling, even with walls behind him - Mod assist for standing with pt catching/tossing ball (mother tossing ball to pt)   Gait;  Pt amb. With hand held assist 35' around track with cues for step length and in attempt to decrease amount of UE support from PT                           PT Long Term Goals - 01/23/19 2126      PT LONG TERM GOAL #1   Title  Pt will stand for 3" unsupported (no external support of UE's or LE' to assist with stabilization) with SBA on flat surface.    Baseline  Pt unable to stand unsupported at eval - braced both legs against mat table and caregiver's hand for UE support due to fear of  falling    Time  6    Period  Weeks    Status  New      PT LONG TERM GOAL #2   Title  Pt will amb. 4475' in clinic gym without UE support with SBA for increased independence with mobility in home environment.    Baseline  2850' with caregiver's hand held assist due to pt's significant fear of falling    Time  6    Period  Weeks    Status  New      PT LONG TERM GOAL #3   Title  Pt will negotiate 4 steps with use of bilateral hand rails with SBA.    Baseline  to be assessed when pt is less fearful of falling - caregiver reports pt will not climb onto anything such as golf cart or exam table at MD's office due to fear of falling    Time  6    Period  Weeks    Status  New      PT LONG TERM GOAL #4   Title  Amb. with RW 230' for increased community accessibility with CGA on flat, even surface.    Baseline  5950' with hand held assist due to fear of falling    Time  6    Period  Weeks    Status  New       PT LONG TERM GOAL #5   Title  Caregiver will demonstrate understanding of HEP for balance and vestibular deficits.    Baseline  dependent    Time  6    Period  Weeks    Status  New            Plan - 01/23/19 2051    Clinical Impression Statement  Pt has decreased attention to Lt side with body turned toward Rt side with trunk rotated toward Rt; pt also presents with decreased visual scanning with decreased smooth pursuits to left side.  Pt remains fearful of falling, but was able to transfer to quadruped position on mat and also on floor with mod assist on first rep on mat table and with min assist from standing to quadruped position on mat on the floor.  Pt unable to stand unsupported and catch and toss ball due to fear of falling.    Personal Factors and Comorbidities  Behavior Pattern;Comorbidity 2;Education;Past/Current Experience;Time since onset of injury/illness/exacerbation    Comorbidities  DiGeorge syndrome, developmental delay    Examination-Activity Limitations  Bathing;Locomotion Level;Transfers;Bed Mobility;Bend;Carry;Dressing;Lift;Toileting;Stand;Stairs;Squat    Examination-Participation Restrictions  Community Activity;School;Shop;Interpersonal Relationship;Meal Prep    Stability/Clinical Decision Making  Evolving/Moderate complexity    Rehab Potential  Fair   due to severity of impairments with cognitive deficits   PT Frequency  2x / week    PT Duration  6 weeks    PT Treatment/Interventions  ADLs/Self Care Home Management;Gait training;Stair training;Neuromuscular re-education;Patient/family education;DME Instruction;Balance training;Therapeutic exercise;Therapeutic activities;Vestibular    PT Next Visit Plan  balance training, try tall kneeling - tossing ball;  activities with head turns in unsupported seated position; assess step negotiation    PT Home Exercise Plan  tossing ball, tall kneeling, standing unsupported    Consulted and Agree with Plan of Care  Family  member/caregiver    Family Member Consulted  mother, Rexene AlbertsBridgett and caregiver - Aneta Minshillip       Patient will benefit from skilled therapeutic intervention in order to improve the following deficits and impairments:  Dizziness, Impaired vision/preception, Decreased balance, Decreased cognition,  Decreased mobility, Difficulty walking  Visit Diagnosis: Other abnormalities of gait and mobility  Unsteadiness on feet     Problem List Patient Active Problem List   Diagnosis Date Noted  . 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 RBBB 07/13/2017  . Abnormality of gait 07/16/2015  . 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 DiGeorge sequence [facility]) 10/18/2011    Amea Mcphail, Donavan Burnet, PT 01/23/2019, 9:29 PM  [facility] [address] [place], [place], [address] Phone: [phone]   Fax:  [phone]  Name: GABRIAN HOQUE MRN: [id number] Date of Birth: 12-Sep-1990

## 2019-01-31 ENCOUNTER — Other Ambulatory Visit: Payer: Self-pay

## 2019-01-31 ENCOUNTER — Ambulatory Visit: Payer: Medicaid Other | Attending: Family Medicine | Admitting: Physical Therapy

## 2019-01-31 DIAGNOSIS — R2681 Unsteadiness on feet: Secondary | ICD-10-CM

## 2019-01-31 DIAGNOSIS — R2689 Other abnormalities of gait and mobility: Secondary | ICD-10-CM | POA: Diagnosis not present

## 2019-02-01 ENCOUNTER — Encounter: Payer: Self-pay | Admitting: Physical Therapy

## 2019-02-01 NOTE — Therapy (Signed)
[facility] [address] [place], [place], [address] Phone: [phone]   Fax:  [phone]  Physical Therapy Treatment  Patient Details  Name: Bradley Cobb MRN: [id number] Date of Birth: Dec 25, 1990 Referring Provider (PT): Tracey Harries, MD   Encounter Date: 01/31/2019  PT End of Session - 02/01/19 2038    Visit Number  3    Number of Visits  13    Date for PT Re-Evaluation  02/28/19    Authorization Type  Medicaid    PT Start Time  0934    PT Stop Time  1017    PT Time Calculation (min)  43 min    Activity Tolerance  --   limited by fear of falling   Behavior During Therapy  Flat affect       Past Medical History:  Diagnosis Date  . 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 Heart disease   . Imbalance     Past Surgical History:  Procedure Laterality Date  . CARDIAC SURGERY  1992  . HERNIA REPAIR  2001    There were no vitals filed for this visit.  Subjective Assessment - 02/01/19 2035    Subjective  Pt presents to PT accompanied by mother and caregiver, Nanna;  pt amb. with hand held assist    Patient is accompained by:  Family member   mother and caregiver   Patient Stated Goals  mother's goal - for him to be able to walk safely without RW= status fluctuates    Currently in Pain?  No/denies        TherAct; bed mobility training - sit to Rt sidelying and then attempt for pt to roll onto left side - pt resisted despite many attempts and much assistance by PT attempting to position pt in Lt sidelying - due to fear/avoidance of Lt side; pt attends to Rt side as noted during PT session  Pt performed sitting - lateral trunk lean to touch each elbow on mat - to fascilitate lateral trunk flexion on each side  Neuro Re-ed;  Tall kneeling on floor with min to mod assist; Pt performed partial squats sitting back on heels in tall kneeling position 1/2 kneeling on each leg with mod assist to assume position and maintain  balance  Pt stood and reached for 5 bean bags on right/left sides behind him and tossed in basket with min hand held assist   Gait:  Gait trained with minimal hand held assist - pt held PT's Rt thumb for minimal support - pt amb. 230'; pt declined to attempt Step negotiation, stating "I might fall"                            PT Long Term Goals - 02/01/19 2045      PT LONG TERM GOAL #1   Title  Pt will stand for 3" unsupported (no external support of UE's or LE' to assist with stabilization) with SBA on flat surface.    Baseline  Pt unable to stand unsupported at eval - braced both legs against mat table and caregiver's hand for UE support due to fear of falling    Time  6    Period  Weeks    Status  New      PT LONG TERM GOAL #2   Title  Pt will amb. 33' in clinic gym without UE support with SBA for increased independence with mobility in  home environment.    Baseline  4850' with caregiver's hand held assist due to pt's significant fear of falling    Time  6    Period  Weeks    Status  New      PT LONG TERM GOAL #3   Title  Pt will negotiate 4 steps with use of bilateral hand rails with SBA.    Baseline  to be assessed when pt is less fearful of falling - caregiver reports pt will not climb onto anything such as golf cart or exam table at MD's office due to fear of falling    Time  6    Period  Weeks    Status  New      PT LONG TERM GOAL #4   Title  Amb. with RW 230' for increased community accessibility with CGA on flat, even surface.    Baseline  5150' with hand held assist due to fear of falling    Time  6    Period  Weeks    Status  New      PT LONG TERM GOAL #5   Title  Caregiver will demonstrate understanding of HEP for balance and vestibular deficits.    Baseline  dependent    Time  6    Period  Weeks    Status  New            Plan - 02/01/19 2040    Clinical Impression Statement  Pt unable to turn onto Lt side from Rt sidelying  position due to fear and decreased attention to left side. Tried to physically turn pt from Rt sidelying to Lt side but pt resisted.  Pt demonstrated improved standing balance by standing against wall and catching/tossing ball without requiring hands on assist to reduce fear of falling.  Pt also took 3-4 steps from mat to get caregiver's purse without holding onto object or mother's hand for assistance.    Personal Factors and Comorbidities  Behavior Pattern;Comorbidity 2;Education;Past/Current Experience;Time since onset of injury/illness/exacerbation    Comorbidities  DiGeorge syndrome, developmental delay    Examination-Activity Limitations  Bathing;Locomotion Level;Transfers;Bed Mobility;Bend;Carry;Dressing;Lift;Toileting;Stand;Stairs;Squat    Examination-Participation Restrictions  Community Activity;School;Shop;Interpersonal Relationship;Meal Prep    Stability/Clinical Decision Making  Evolving/Moderate complexity    Rehab Potential  Fair   due to severity of impairments with cognitive deficits   PT Frequency  2x / week    PT Duration  6 weeks    PT Treatment/Interventions  ADLs/Self Care Home Management;Gait training;Stair training;Neuromuscular re-education;Patient/family education;DME Instruction;Balance training;Therapeutic exercise;Therapeutic activities;Vestibular    PT Next Visit Plan  balance training, try tall kneeling - tossing ball;  activities with head turns in unsupported seated position; assess step negotiation    PT Home Exercise Plan  tossing ball, tall kneeling, standing unsupported    Consulted and Agree with Plan of Care  Family member/caregiver    Family Member Consulted  mother, Rexene AlbertsBridgett and caregiver - Aneta Minshillip       Patient will benefit from skilled therapeutic intervention in order to improve the following deficits and impairments:  Dizziness, Impaired vision/preception, Decreased balance, Decreased cognition, Decreased mobility, Difficulty walking  Visit  Diagnosis: Other abnormalities of gait and mobility  Unsteadiness on feet     Problem List Patient Active Problem List   Diagnosis Date Noted  . 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 RBBB 07/13/2017  . Abnormality of gait 07/16/2015  . 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 [place], [place], [address] Phone: [phone]   Fax:  [phone]  Name: CAMDYN BESKE MRN: [id number] Date of Birth: [birth year]

## 2019-02-07 ENCOUNTER — Other Ambulatory Visit: Payer: Self-pay

## 2019-02-07 ENCOUNTER — Ambulatory Visit: Payer: Medicaid Other | Admitting: Physical Therapy

## 2019-02-07 ENCOUNTER — Encounter: Payer: Self-pay | Admitting: Physical Therapy

## 2019-02-07 DIAGNOSIS — R2689 Other abnormalities of gait and mobility: Secondary | ICD-10-CM | POA: Diagnosis not present

## 2019-02-07 DIAGNOSIS — R2681 Unsteadiness on feet: Secondary | ICD-10-CM

## 2019-02-08 NOTE — Therapy (Addendum)
[facility] [address] [place], [place], [address] Phone: [phone]   Fax:  [phone]  Physical Therapy Treatment  Patient Details  Name: Bradley Cobb MRN: [id number] Date of Birth: 16-Jun-1990 Referring Provider (PT): Bernerd Limbo, MD   Encounter Date: 02/07/2019  PT End of Session - 02/08/19 2143    Visit Number  4    Number of Visits  13    Date for PT Re-Evaluation  02/28/19    Authorization Type  Medicaid    PT Start Time  1017    PT Stop Time  1101    PT Time Calculation (min)  44 min    Activity Tolerance  Patient tolerated treatment well   limited by fear of falling   Behavior During Therapy  Flat affect       Past Medical History:  Diagnosis Date  . DiGeorge syndrome ([place])   . Heart disease   . Imbalance     Past Surgical History:  Procedure Laterality Date  . [place]  . HERNIA REPAIR  2001    There were no vitals filed for this visit.                    [facility] Adult PT Treatment/Exercise - 02/08/19 0001      Ambulation/Gait   Ambulation/Gait  Yes    Ambulation/Gait Assistance  4: Min guard    Ambulation Distance (Feet)  230 Feet    Assistive device  1 person hand held assist    Gait Pattern  Step-through pattern    Ambulation Surface  Level;Indoor    Stairs  Yes    Stairs Assistance  5: Supervision    Stair Management Technique  Two rails;Step to pattern;Forwards   both hands on Rt rail descending    Number of Stairs  4    Height of Stairs  6      Neuro Re-ed    Neuro Re-ed Details   Tall kneeling on mat table - pt able to assume tall kneeling position with min to mod assist;  attempted to get pt to roll onto left side but he did not - pt avoids rolling onto left side for reason unknown       Ladder on floor - pt negotiated ladder step over step sequence attempted with min hand held assist - 4 reps    TherAct;  Unsupported sitting on side of  mat - pt participated in Zoom ball activity with assist of caregiver - caregiver provided  assist for returning ball and to hold onto pt due to fear of falling Pt stood (legs braced against mat table) and reached for whackers - to fascilitate pt standing and walking without external support   Pt carried basket of whackers to caregiver sitting in chair approx. 6' on pt's left side   NeuroRe-ed:  Stepping stones (4) used - pt stepped on each stepping stone with min to mod hand held assist and verbal & tactile cues for sequence         PT Long Term Goals - 02/08/19 2150      PT LONG TERM GOAL #1   Title  Pt will stand for 3" unsupported (no external support of UE's or LE' to assist with stabilization) with SBA on flat surface.    Baseline  Pt now standing with legs against mat table - minimal support but pt is unable to stand totally unsupported without touching object  or person - 02-07-19    Time  6    Period  Weeks    Status  Not Met      PT LONG TERM GOAL #2   Title  Pt will amb. 72' in clinic gym without UE support with SBA for increased independence with mobility in home environment.    Baseline  115' with pt holding onto PT's thumb only - min. support needed as pt is unable to amb. unsupported due to excessive fear of falling - 02-07-19    Time  6    Period  Weeks    Status  Not Met      PT LONG TERM GOAL #3   Title  Pt will negotiate 4 steps with use of bilateral hand rails with SBA.    Baseline  met 02-07-19    Time  6    Period  Weeks    Status  Achieved      PT LONG TERM GOAL #4   Title  Amb. with RW 230' for increased community accessibility with CGA on flat, even surface.    Baseline  met 02-07-19    Time  6    Period  Weeks    Status  New      PT LONG TERM GOAL #5   Title  Caregiver will demonstrate understanding of HEP for balance and vestibular deficits.    Baseline  met 02-07-19    Time  6    Period  Weeks    Status  Achieved            Plan -  02/08/19 2144    Clinical Impression Statement  Pt continues to avoid turning onto left side from supine position (reason unknown); attempted to encourage pt to amb. without holding onto object or person by having him hold basket with 2 hands, but pt turned so that his leg was touching mat table.  Pt unable to stand unsupported today (or fearful to attempt);  pt frequently states he will fall, expressing fear of falling and need for constant touching surface with his legs or holding with his hand for orientation.  Pt did demonstrate some progress by leaning against wall to catch/toss ball without needing someone to physically hold onto him.    Personal Factors and Comorbidities  Behavior Pattern;Comorbidity 2;Education;Past/Current Experience;Time since onset of injury/illness/exacerbation    Comorbidities  DiGeorge syndrome, developmental delay    Examination-Activity Limitations  Bathing;Locomotion Level;Transfers;Bed Mobility;Bend;Carry;Dressing;Lift;Toileting;Stand;Stairs;Squat    Examination-Participation Restrictions  Community Activity;School;Shop;Interpersonal Relationship;Meal Prep    Stability/Clinical Decision Making  Evolving/Moderate complexity    Rehab Potential  Fair   due to severity of impairments with cognitive deficits   PT Frequency  2x / week    PT Duration  6 weeks    PT Treatment/Interventions  ADLs/Self Care Home Management;Gait training;Stair training;Neuromuscular re-education;Patient/family education;DME Instruction;Balance training;Therapeutic exercise;Therapeutic activities;Vestibular    PT Next Visit Plan  balance training, try tall kneeling - tossing ball;  activities with head turns in unsupported seated position; assess step negotiation    PT Home Exercise Plan  tossing ball, tall kneeling, standing unsupported    Consulted and Agree with Plan of Care  Family member/caregiver    Family Member Consulted  mother, Deatra James and caregiver - Doren Custard       Patient will  benefit from skilled therapeutic intervention in order to improve the following deficits and impairments:  Dizziness, Impaired vision/preception, Decreased balance, Decreased cognition, Decreased mobility, Difficulty walking  Visit Diagnosis: Other  abnormalities of gait and mobility  Unsteadiness on feet     Problem List Patient Active Problem List   Diagnosis Date Noted  . 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 RBBB 07/13/2017  . Abnormality of gait 07/16/2015  . 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 [place], [place], [address] Phone: [phone]   Fax:  [phone]  Name: Bradley Cobb MRN: [id number] Date of Birth: 1990/09/26

## 2019-03-20 NOTE — Progress Notes (Signed)
   02/08/19 2144  Plan  Clinical Impression Statement Pt continues to avoid turning onto left side from supine position (reason unknown); attempted to encourage pt to amb. without holding onto object or person by having him hold basket with 2 hands, but pt turned so that his leg was touching mat table.  Pt unable to stand unsupported today (or fearful to attempt);  pt frequently states he will fall, expressing fear of falling and need for constant touching surface with his legs or holding with his hand for orientation.  Pt did demonstrate some progress by leaning against wall to catch/toss ball without needing someone to physically hold onto him.  Personal Factors and Comorbidities Behavior Pattern;Comorbidity 2;Education;Past/Current Experience;Time since onset of injury/illness/exacerbation  Comorbidities DiGeorge syndrome, developmental delay  Examination-Activity Limitations Bathing;Locomotion Level;Transfers;Bed Mobility;Bend;Carry;Dressing;Lift;Toileting;Stand;Stairs;Squat  Examination-Participation Restrictions Community Activity;School;Shop;Interpersonal Relationship;Meal Prep  Pt will benefit from skilled therapeutic intervention in order to improve on the following deficits Dizziness;Impaired vision/preception;Decreased balance;Decreased cognition;Decreased mobility;Difficulty walking  Stability/Clinical Decision Making Evolving/Moderate complexity  Rehab Potential Fair (due to severity of impairments with cognitive deficits)  PT Frequency 1x / week  PT Duration 6 weeks  PT Treatment/Interventions ADLs/Self Care Home Management;Gait training;Stair training;Neuromuscular re-education;Patient/family education;DME Instruction;Balance training;Therapeutic exercise;Therapeutic activities;Vestibular  PT Next Visit Plan balance training, try tall kneeling - tossing ball;  activities with head turns in unsupported seated position; assess step negotiation  PT Home Exercise Plan tossing ball, tall  kneeling, standing unsupported  Consulted and Agree with Plan of Care Family member/caregiver  Family Member Consulted mother, [place] and caregiver - Aneta Mins

## 2019-03-20 NOTE — Addendum Note (Signed)
Addended by: Althea Charon on: 03/20/2019 11:37 AM   Modules accepted: Orders

## 2019-04-04 ENCOUNTER — Other Ambulatory Visit: Payer: Self-pay

## 2019-04-04 ENCOUNTER — Ambulatory Visit: Payer: Medicaid Other | Attending: Family Medicine | Admitting: Physical Therapy

## 2019-04-04 DIAGNOSIS — R2689 Other abnormalities of gait and mobility: Secondary | ICD-10-CM

## 2019-04-04 DIAGNOSIS — R2681 Unsteadiness on feet: Secondary | ICD-10-CM

## 2019-04-05 ENCOUNTER — Encounter: Payer: Self-pay | Admitting: Physical Therapy

## 2019-04-05 NOTE — Therapy (Signed)
[facility] [address] [place], [place], [address] Phone: [phone]   Fax:  [phone]  Physical Therapy Treatment  Patient Details  Name: Bradley Cobb MRN: [id number] Date of Birth: 04/16/1990 Referring Provider (PT): Bernerd Limbo, MD   Encounter Date: 04/04/2019  PT End of Session - 04/05/19 1616    Visit Number  1   visit 5 total   Number of Visits  3    Date for PT Re-Evaluation  04/24/19    Authorization Type  Medicaid    Authorization Time Period  04-04-19 - 04-24-19    Authorization - Visit Number  1    Authorization - Number of Visits  3    PT Start Time  4315    PT Stop Time  1106    PT Time Calculation (min)  51 min    Activity Tolerance  Patient tolerated treatment well   limited by fear of falling   Behavior During Therapy  Flat affect       Past Medical History:  Diagnosis Date  . 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 Heart disease   . Imbalance     Past Surgical History:  Procedure Laterality Date  . [place]  . HERNIA REPAIR  2001    There were no vitals filed for this visit.  Subjective Assessment - 04/05/19 1612    Subjective  Pt presents to PT accompanied by his mother and caregiver; mother states she does not see much improvement in pt's mobility status as he continues to have significant fear of falling and holds onto a person's hand or objects/furniture for security due to fear of falling    Patient is accompained by:  Family member   mother and caregiver   Patient Stated Goals  mother's goal - for him to be able to walk safely without RW= status fluctuates    Currently in Pain?  No/denies                       [facility] Adult PT Treatment/Exercise - 04/05/19 0001      Ambulation/Gait   Ambulation/Gait  Yes    Ambulation/Gait Assistance  5: Supervision    Ambulation Distance (Feet)  230 Feet    Assistive device  Rolling walker    Gait Pattern  Step-through  pattern    Ambulation Surface  Level;Indoor      Pt performed balance activities to fascilitate movement/standing/ambulation with decreased UE support - balance Bubbles, stepping stones used to fascilitate SLS and longer step length - HHA provided  Rockerboard used in corner for active weight shifting with UE support- anterior/posterior approx. 15 reps with min assist  Mini trampoline - placed beside counter for UE support - pt performed jumping (small jumps) 2 sets 10 reps with UE support On counter and with HHA from PT            PT Long Term Goals - 04/05/19 1621      PT LONG TERM GOAL #1   Title  Pt will stand for 3" unsupported (no external support of UE's or LE' to assist with stabilization) with SBA on flat surface.    Baseline  Pt now standing with legs against mat table - minimal support but pt is unable to stand totally unsupported without touching object or person - 02-07-19    Time  6    Period  Weeks    Status  Not Met  PT LONG TERM GOAL #2   Title  Pt will amb. 70' in clinic gym without UE support with SBA for increased independence with mobility in home environment.    Baseline  115' with pt holding onto PT's thumb only - min. support needed as pt is unable to amb. unsupported due to excessive fear of falling - 02-07-19    Time  6    Period  Weeks    Status  Not Met      PT LONG TERM GOAL #3   Title  Pt will negotiate 4 steps with use of bilateral hand rails with SBA.    Baseline  met 02-07-19    Time  6    Period  Weeks    Status  Achieved      PT LONG TERM GOAL #4   Title  Amb. with RW 230' for increased community accessibility with CGA on flat, even surface.    Baseline  met 02-07-19    Time  6    Period  Weeks    Status  Achieved      PT LONG TERM GOAL #5   Title  Caregiver will demonstrate understanding of HEP for balance and vestibular deficits.    Baseline  met 02-07-19    Time  6    Period  Weeks    Status  Achieved      PT LONG  TERM GOAL #6   Title  Pt will ambulate 4' for household amb. modified independently without hand held assist from caregiver and without UE support on walls or environmental objects.    Baseline  Pt is currently requiring min hand held assist from caregiver to amb. short distances in home, i.e. 10', due to significant fear of falling    Time  6    Period  Weeks    Status  New      PT LONG TERM GOAL #7   Title  Pt will negotiate 4 steps with use of only 1 hand rail using a step over step sequence for increased efficiency.    Baseline  Pt requires use of 2 hand rails step by step due to fear of falling    Time  6    Period  Weeks    Status  New      PT LONG TERM GOAL #8   Title  Pt will stand for at least 5" with SBA without UE support or leaning on environmental object for increased independence and safety with ADL's in standing.    Baseline  Pt currently leans against counter for support for stability due to fear of falling - able to stand for approx. 3"    Time  6    Period  Weeks    Status  New            Plan - 04/05/19 1617    Clinical Impression Statement  Pt continues to be extremely fearful of falling - holds onto someone's hand or an object when moving from place to place during ambulation.  Pt hesistant to let go of PT's hand in order for PT to switch hands in providing hand held assist.  Pt appears to have a sensory processing disorder which is impacting mobility.  Pt did jump on mini trampoline with HHA in RUE and LUE on counter for support.    Personal Factors and Comorbidities  Behavior Pattern;Comorbidity 2;Education;Past/Current Experience;Time since onset of injury/illness/exacerbation    Comorbidities  DiGeorge syndrome, developmental delay  Examination-Activity Limitations  Bathing;Locomotion Level;Transfers;Bed Mobility;Bend;Carry;Dressing;Lift;Toileting;Stand;Stairs;Squat    Examination-Participation Restrictions  Community Activity;School;Shop;Interpersonal  Relationship;Meal Prep    Stability/Clinical Decision Making  Evolving/Moderate complexity    Rehab Potential  Fair   due to severity of impairments with cognitive deficits   PT Frequency  1x / week    PT Duration  6 weeks    PT Treatment/Interventions  ADLs/Self Care Home Management;Gait training;Stair training;Neuromuscular re-education;Patient/family education;DME Instruction;Balance training;Therapeutic exercise;Therapeutic activities;Vestibular    PT Next Visit Plan  balance training, try tall kneeling - tossing ball;  activities with head turns in unsupported seated position; assess step negotiation    PT Home Exercise Plan  tossing ball, tall kneeling, standing unsupported    Consulted and Agree with Plan of Care  Family member/caregiver    Family Member Consulted  mother, Deatra James and caregiver - Doren Custard       Patient will benefit from skilled therapeutic intervention in order to improve the following deficits and impairments:  Dizziness, Impaired vision/preception, Decreased balance, Decreased cognition, Decreased mobility, Difficulty walking  Visit Diagnosis: Other abnormalities of gait and mobility  Unsteadiness on feet     Problem List Patient Active Problem List   Diagnosis Date Noted  . 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 RBBB 07/13/2017  . Abnormality of gait 07/16/2015  . 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 [place], [place], [address] Phone: [phone]   Fax:  [phone]  Name: Bradley Cobb MRN: [id number] Date of Birth: 17-Jun-1990

## 2020-01-27 ENCOUNTER — Telehealth: Payer: Self-pay | Admitting: Cardiology

## 2020-01-27 NOTE — Telephone Encounter (Signed)
Bradley Cobb is calling stating Bradley Cobb currently has Covid and she is wanting to know Dr. Jenene Slicker opinion on getting him an infusion. She states he does not seem very sick and she would prefer not to get it if he feels it is not needed. She also wanted to know if Dr. Antoine Poche feels a f/u should be scheduled once he has recovered due to hearing heart problems sometimes occur in males that get Covid. Please advise.

## 2020-01-28 NOTE — Telephone Encounter (Addendum)
Spoke with Bridgette who states patient is only having headaches Reviewed Dr Hochrein's comments  She is willing to speak with [facility] infusions to see if necessary for patient to have, message has been sent to pool and they will reach out to [facility]

## 2020-01-28 NOTE — Telephone Encounter (Signed)
He should talk to the folks who administer the therapy.  We have a phone number for [facility] (I don't know it off hand).  Persons with congenital heart disease are at high risk for developing severe disease and generally would qualify for the infusion if they are within the time window.  (I am on vacation so I am just seeing this message.)  Dr. Antoine Poche

## 2020-01-29 ENCOUNTER — Telehealth: Payer: Self-pay | Admitting: Nurse Practitioner

## 2020-01-29 DIAGNOSIS — U071 COVID-19: Secondary | ICD-10-CM

## 2020-01-29 NOTE — Telephone Encounter (Signed)
Called to discuss with Cherylann Parr about Covid symptoms and the use of a monoclonal antibody infusion for those with mild to moderate Covid symptoms and at a high risk of hospitalization.  Patient is special needs and I discussed treatment with his mother on his behalf.    Pt is qualified for this infusion at the [facility] due to co-morbid conditions and/or a member of an at-risk group, however declines infusion at this time. She feels that his symptoms are mild. He is currently on his 8th day of symptoms and she believes he will not tolerate iv infusion well.   Symptoms tier reviewed as well as criteria for ending isolation.  Symptoms reviewed that would warrant ED/Hospital evaluation. Preventative practices reviewed. Patient verbalized understanding. Patient advised to call back if he/she opts to proceed with infusion. Callback number provided. Urgent care and/or ER precautions given for severe symptoms. Last date eligible for infusion: 01/31/20.      Patient Active Problem List   Diagnosis Date Noted  . 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 RBBB 07/13/2017  . Abnormality of gait 07/16/2015  . 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

## 2020-11-29 ENCOUNTER — Emergency Department (INDEPENDENT_AMBULATORY_CARE_PROVIDER_SITE_OTHER)
Admission: EM | Admit: 2020-11-29 | Discharge: 2020-11-29 | Disposition: A | Discharge status: Home / Self Care | Payer: Medicaid Other | Source: Home / Self Care

## 2020-11-29 ENCOUNTER — Other Ambulatory Visit: Payer: Self-pay

## 2020-11-29 ENCOUNTER — Encounter: Payer: Self-pay | Admitting: Emergency Medicine

## 2020-11-29 DIAGNOSIS — H60392 Other infective otitis externa, left ear: Secondary | ICD-10-CM

## 2020-11-29 MED ORDER — NEOMYCIN-POLYMYXIN-HC 3.5-10000-1 OT SUSP: 4.0000 [drp] | Freq: Three times a day (TID) | OTIC | 0 refills | Status: AC

## 2020-11-29 MED ORDER — CEFDINIR 250 MG/5ML PO SUSR: 300.0000 mg | Freq: Two times a day (BID) | ORAL | 0 refills | Status: AC

## 2020-11-29 NOTE — ED Triage Notes (Signed)
Patient presents to Urgent Care with complaints of left ear pain since today. Patient sister reports pain around the left ear when giving him a bath. Noticed brown and reddish discharge of the ear. History of earaches.Denies any fever or chills. Marland Kitchen

## 2020-11-29 NOTE — ED Provider Notes (Signed)
[facility]    CSN: [id number] Arrival date & time: 11/29/20  1503      History   Chief Complaint Chief Complaint  Patient presents with   Otalgia    HPI Bradley Cobb is a 30 y.o. male.   HPI  recurring ear infections are a problem for Bradley Cobb.  The sister was giving him a bath today and he complained of ear pain when she tried to wash the left side of his face and ear.  She noticed that it was swollen and red.  He is here for evaluation. 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 Authorizing Provider  cefdinir (OMNICEF) 250 MG/5ML suspension Take 6 mLs (300 mg total) by mouth 2 (two) times daily. 11/29/20  Yes Eustace Moore, MD  loratadine (CLARITIN) 10 MG tablet Take 10 mg by mouth.   Yes [provider]  neomycin-polymyxin-hydrocortisone (CORTISPORIN) 3.5-10000-1 OTIC suspension Place 4 drops into both ears 3 (three) times daily. 11/29/20  Yes Eustace Moore, MD  budesonide (RHINOCORT AQUA) 32 MCG/ACT nasal spray Place into the nose as needed.  04/23/15   [provider]  sodium fluoride (SF 5000 PLUS) 1.1 % CREA dental cream Place 1 application onto teeth at bedtime.  11/08/14   [provider]    Family History Family History  Adopted: Yes  Problem Relation Age of Onset   Mental illness Mother     Social History Social History   Tobacco Use   Smoking  status: Never   Smokeless tobacco: Never  Vaping Use   Vaping Use: Never used  Substance Use Topics   Alcohol use: No   Drug use: No     Allergies   Sulfa antibiotics, Other, and Amoxicillin   Review of Systems Review of Systems See HPI  Physical Exam Triage Vital Signs ED Triage Vitals  Enc Vitals Group     BP 11/29/20 1516 (!) 141/93     Pulse Rate 11/29/20 1516 (!) 107     Resp 11/29/20 1516 18     Temp 11/29/20 1516 99.4 F (37.4 C)     Temp Source 11/29/20 1516 Oral     SpO2 11/29/20 1516 98 %     Weight --      Height --      Head Circumference --      Peak Flow --      Pain Score 11/29/20 1514 5     Pain Loc --      Pain Edu? --      Excl. in GC? --    No data found.  Updated Vital Signs BP (!) 141/93 (BP Location: Right Arm)   Pulse (!) 107   Temp 99.4 F (37.4 C) (Oral)   Resp 18   SpO2 98%  Physical Exam Constitutional:      General: He is not in acute distress.    Appearance: He is well-developed.     Comments: Small stature.  Moderate physical and mental impairments noted  HENT:     Head: Normocephalic and atraumatic.     Right Ear: Tympanic membrane normal.     Ears:     Comments: The left canal is swollen.  Gustavus Messing is red.  There is yellow discharge from canal.  The right canal has some moisture, but no tenderness or pain. Eyes:     Conjunctiva/sclera: Conjunctivae normal.     Pupils: Pupils are equal, round, and reactive to light.  Cardiovascular:     Rate and Rhythm: Normal rate.  Pulmonary:     Effort: Pulmonary effort is normal. No respiratory distress.  Abdominal:     General: There is no distension.     Palpations: Abdomen is soft.  Musculoskeletal:        General: Normal range of motion.     Cervical back: Normal range of motion.  Skin:    General: Skin is warm and dry.  Neurological:     Mental Status: He is alert.     UC Treatments / Results  Labs (all labs ordered are listed, but only abnormal results are  displayed) Labs Reviewed - No data to display  EKG   Radiology No results found.  Procedures Procedures (including critical care time)  Medications Ordered in UC Medications - No data to display  Initial Impression / Assessment and Plan / UC Course  I have reviewed the triage vital signs and the nursing notes.  Pertinent labs & imaging results that were available during my care of the patient were reviewed by me and considered in my medical decision making (see chart for details).     Discussed Final Clinical Impressions(s) / UC Diagnoses   Final diagnoses:  Other infective acute otitis externa of left ear     Discharge Instructions      Give antibiotic cefdinir 6 cc by mouth 2 times a day for 10 days Place eardrops in the left ear 3 times a day for 10 days Follow-up with your usual pediatrician   ED Prescriptions     Medication Sig Dispense Auth. Provider   cefdinir (OMNICEF) 250 MG/5ML suspension Take 6 mLs (300 mg total) by mouth 2 (two) times daily. 120 mL Eustace Moore, MD   neomycin-polymyxin-hydrocortisone (CORTISPORIN) 3.5-10000-1 OTIC suspension Place 4 drops into both ears 3 (three) times daily. 10 mL Eustace Moore, MD      PDMP not reviewed this encounter.   Eustace Moore, MD 11/29/20 [phone]

## 2020-11-29 NOTE — Discharge Instructions (Addendum)
Give antibiotic cefdinir 6 cc by mouth 2 times a day for 10 days Place eardrops in the left ear 3 times a day for 10 days Follow-up with your usual pediatrician
